# Patient Record
Sex: Female | Born: 1975 | Race: White | Hispanic: No | Marital: Married | Smoking: Never smoker
Health system: Southern US, Community
[De-identification: ages and names within clinical notes are randomized; demographics above are authoritative.]

---

## 2007-05-26 ENCOUNTER — Ambulatory Visit (HOSPITAL_COMMUNITY): Admission: RE | Admit: 2007-05-26 | Discharge: 2007-05-26 | Payer: Self-pay | Admitting: Obstetrics & Gynecology

## 2007-07-15 ENCOUNTER — Inpatient Hospital Stay (HOSPITAL_COMMUNITY): Admission: RE | Admit: 2007-07-15 | Discharge: 2007-07-18 | Payer: Self-pay | Admitting: Obstetrics & Gynecology

## 2007-07-15 ENCOUNTER — Encounter: Payer: Self-pay | Admitting: Obstetrics & Gynecology

## 2007-12-13 IMAGING — CR DG CHEST 2V
2 series · 2 of 2 positions shown · non-contrast
Comparison: none

CLINICAL DATA: CHEST ? 2 VIEW:

[view not recorded (1 of 2)]
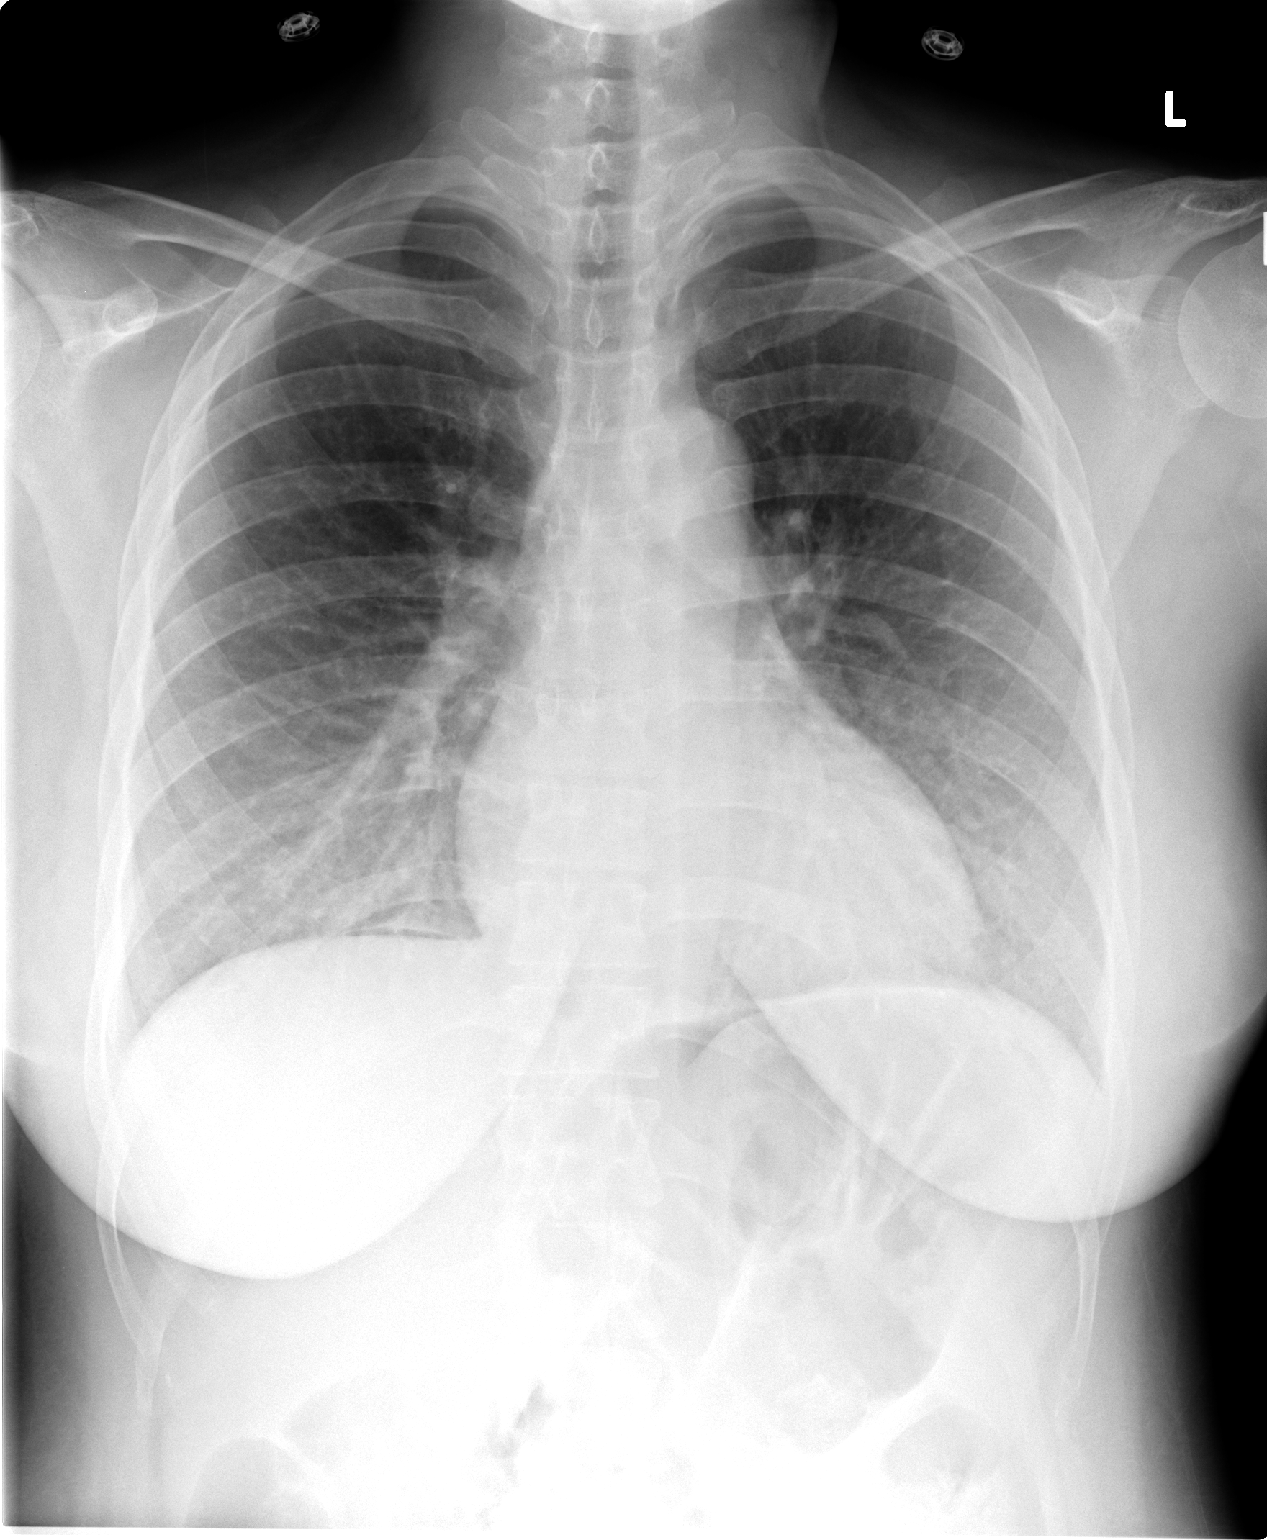

[view not recorded (2 of 2)]
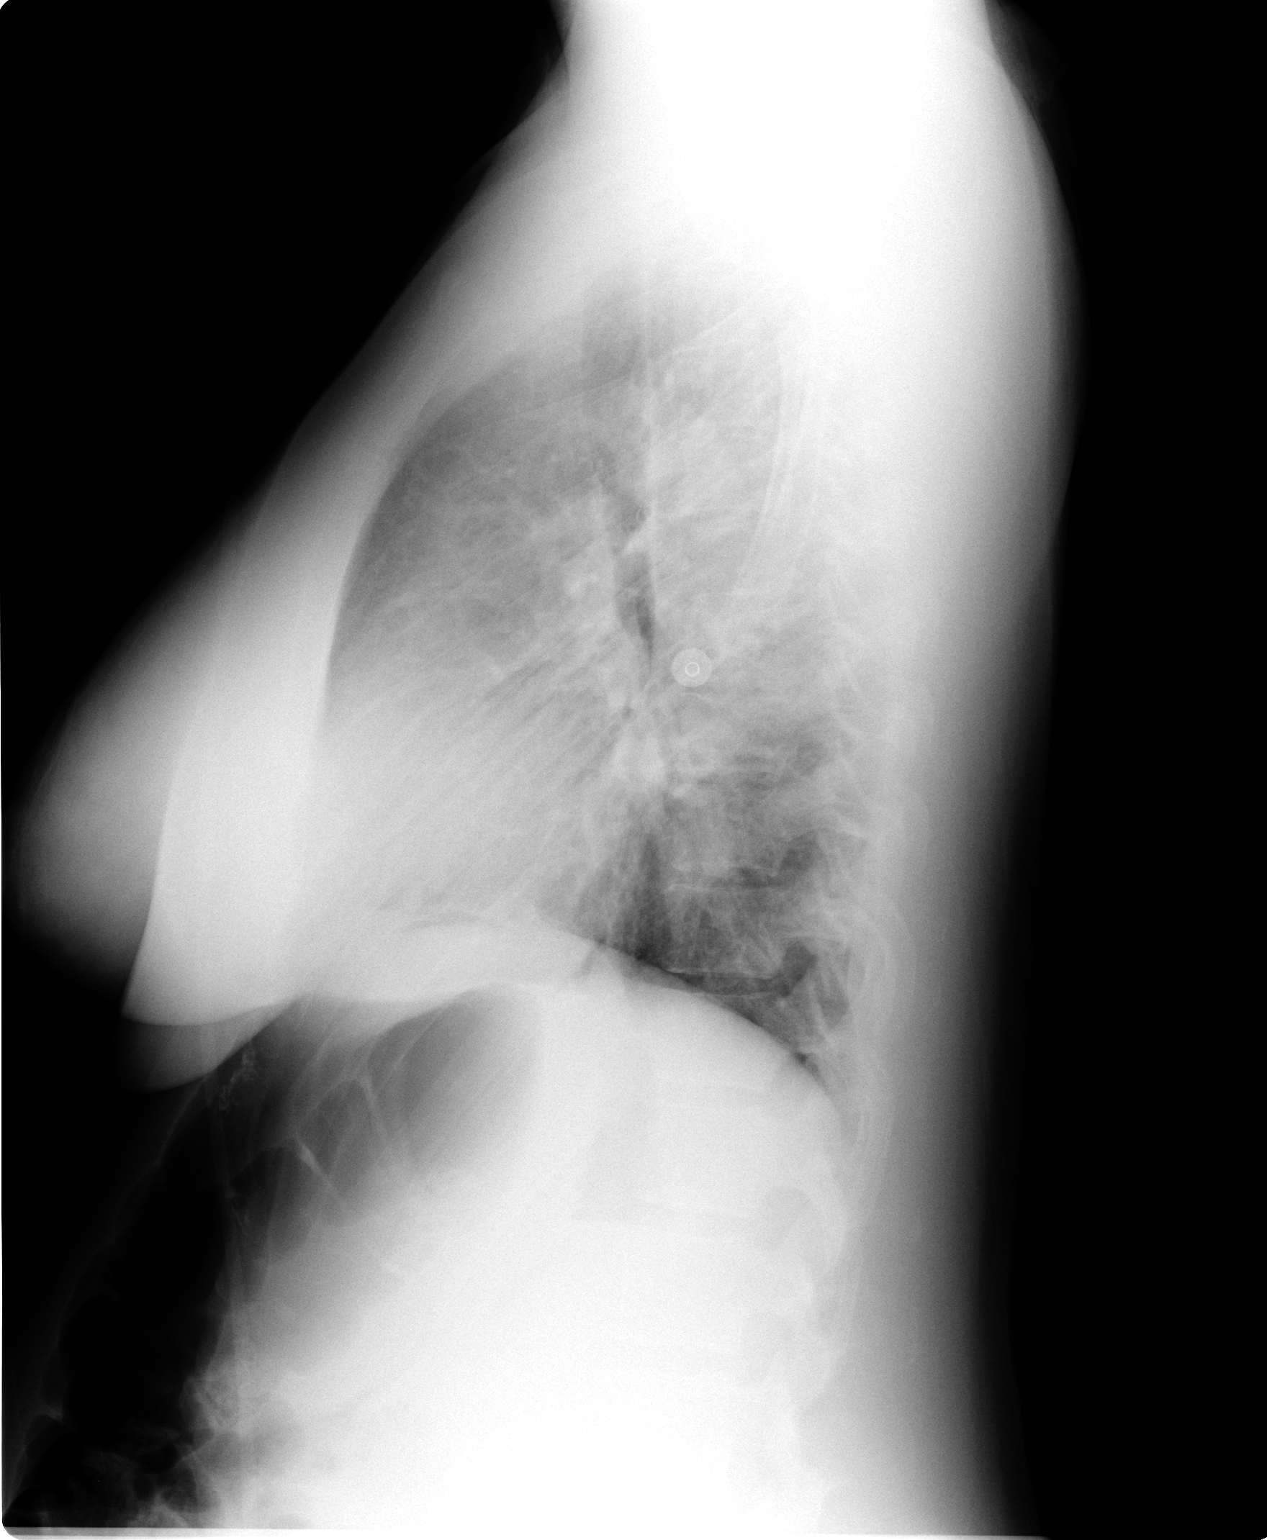

[2 of 2 positions shown; findings below may reference images not displayed]

FINDINGS: The cardiomediastinal silhouette is within normal limits.  The lung fields demonstrate a mild increase in pulmonary vascularity compatible with the patient?s recent postpartum status.  A small right pleural effusion is seen but no signs of alveolar or interstitial edema are suggested.  A small amount of bilateral subdiaphragmatic air is noted compatible with the patient?s recent C-section.  Bony structures are intact.  No radiograph stigmata of TB are noted.
IMPRESSION: Small right pleural effusion, which may be reactionary to the patient?s recent C-section.  Otherwise unremarkable postpartum chest.

## 2008-01-05 IMAGING — CR DG CHEST 2V
1 series · 2 of 2 positions shown · non-contrast
Comparison: NONE

CLINICAL DATA: Positive PPD. 

CHEST TWO VIEW (PA AND LATERAL)

[Series 1: view not recorded · 0.17mm/px · 2 of 2 slices shown]
[im 1/2]
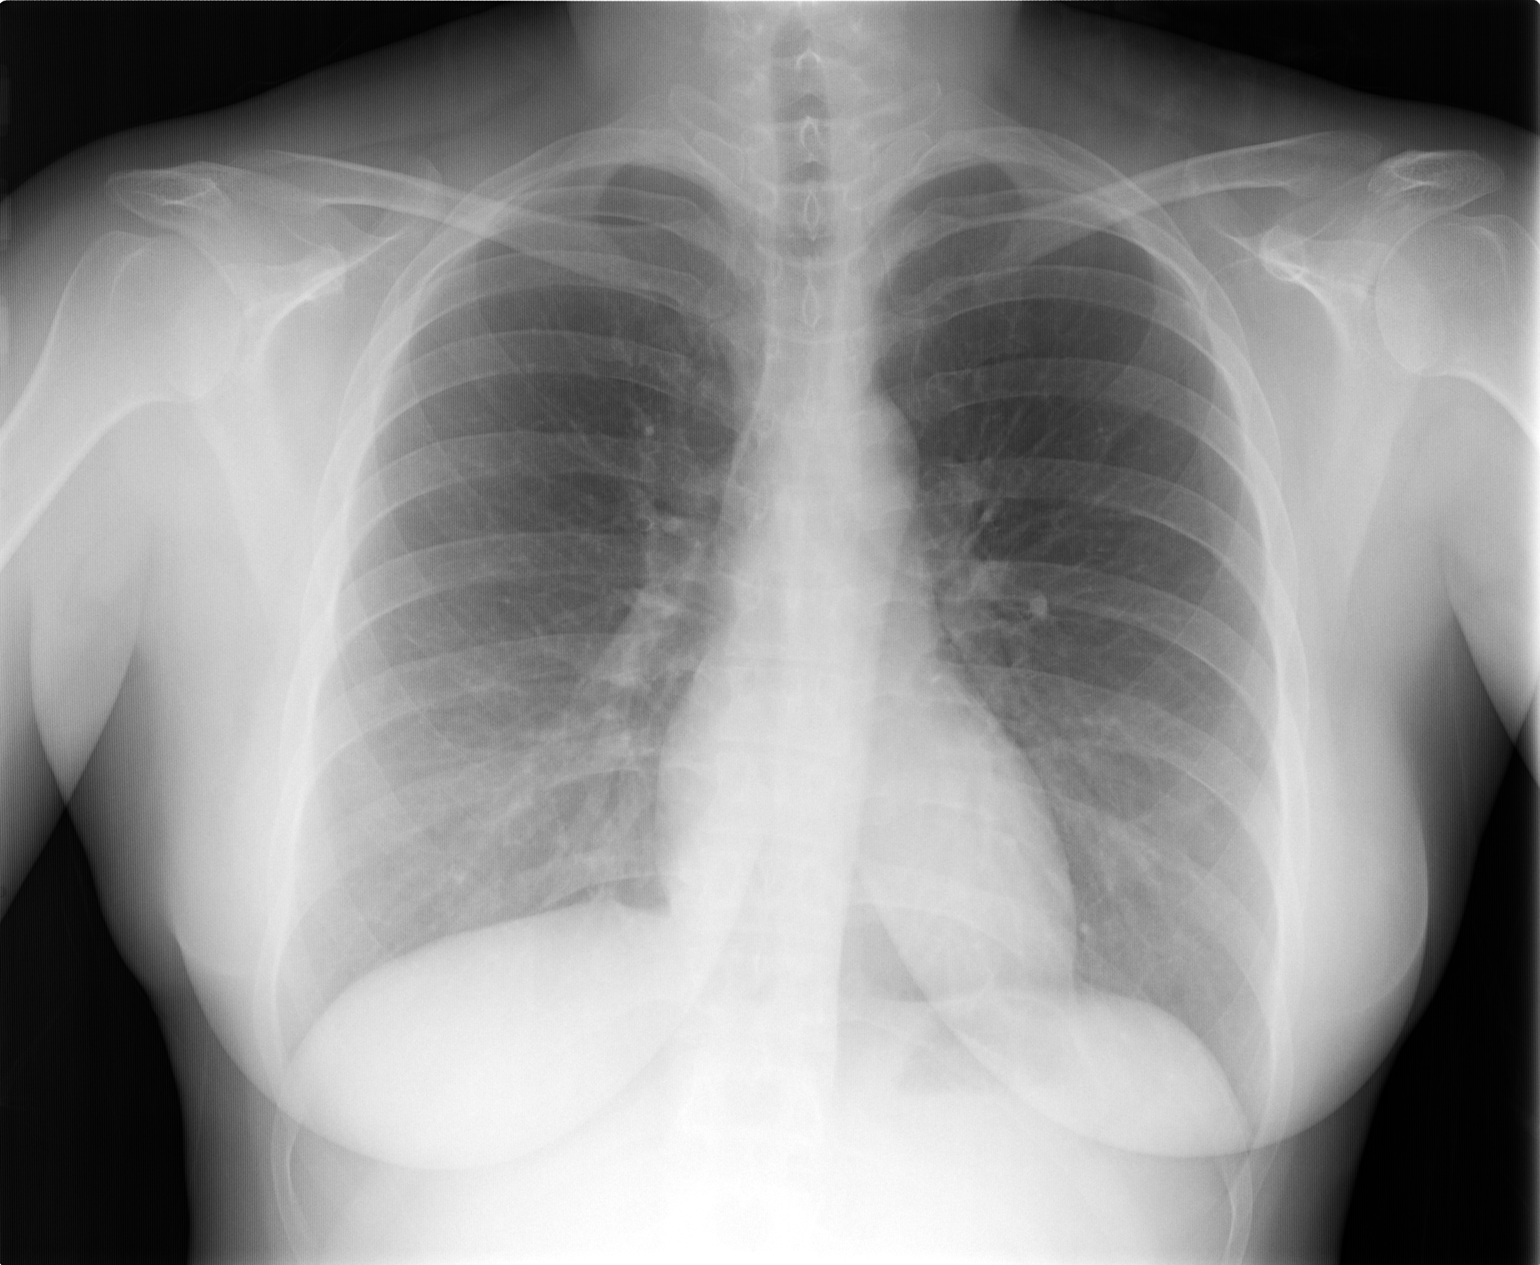
[im 2/2]
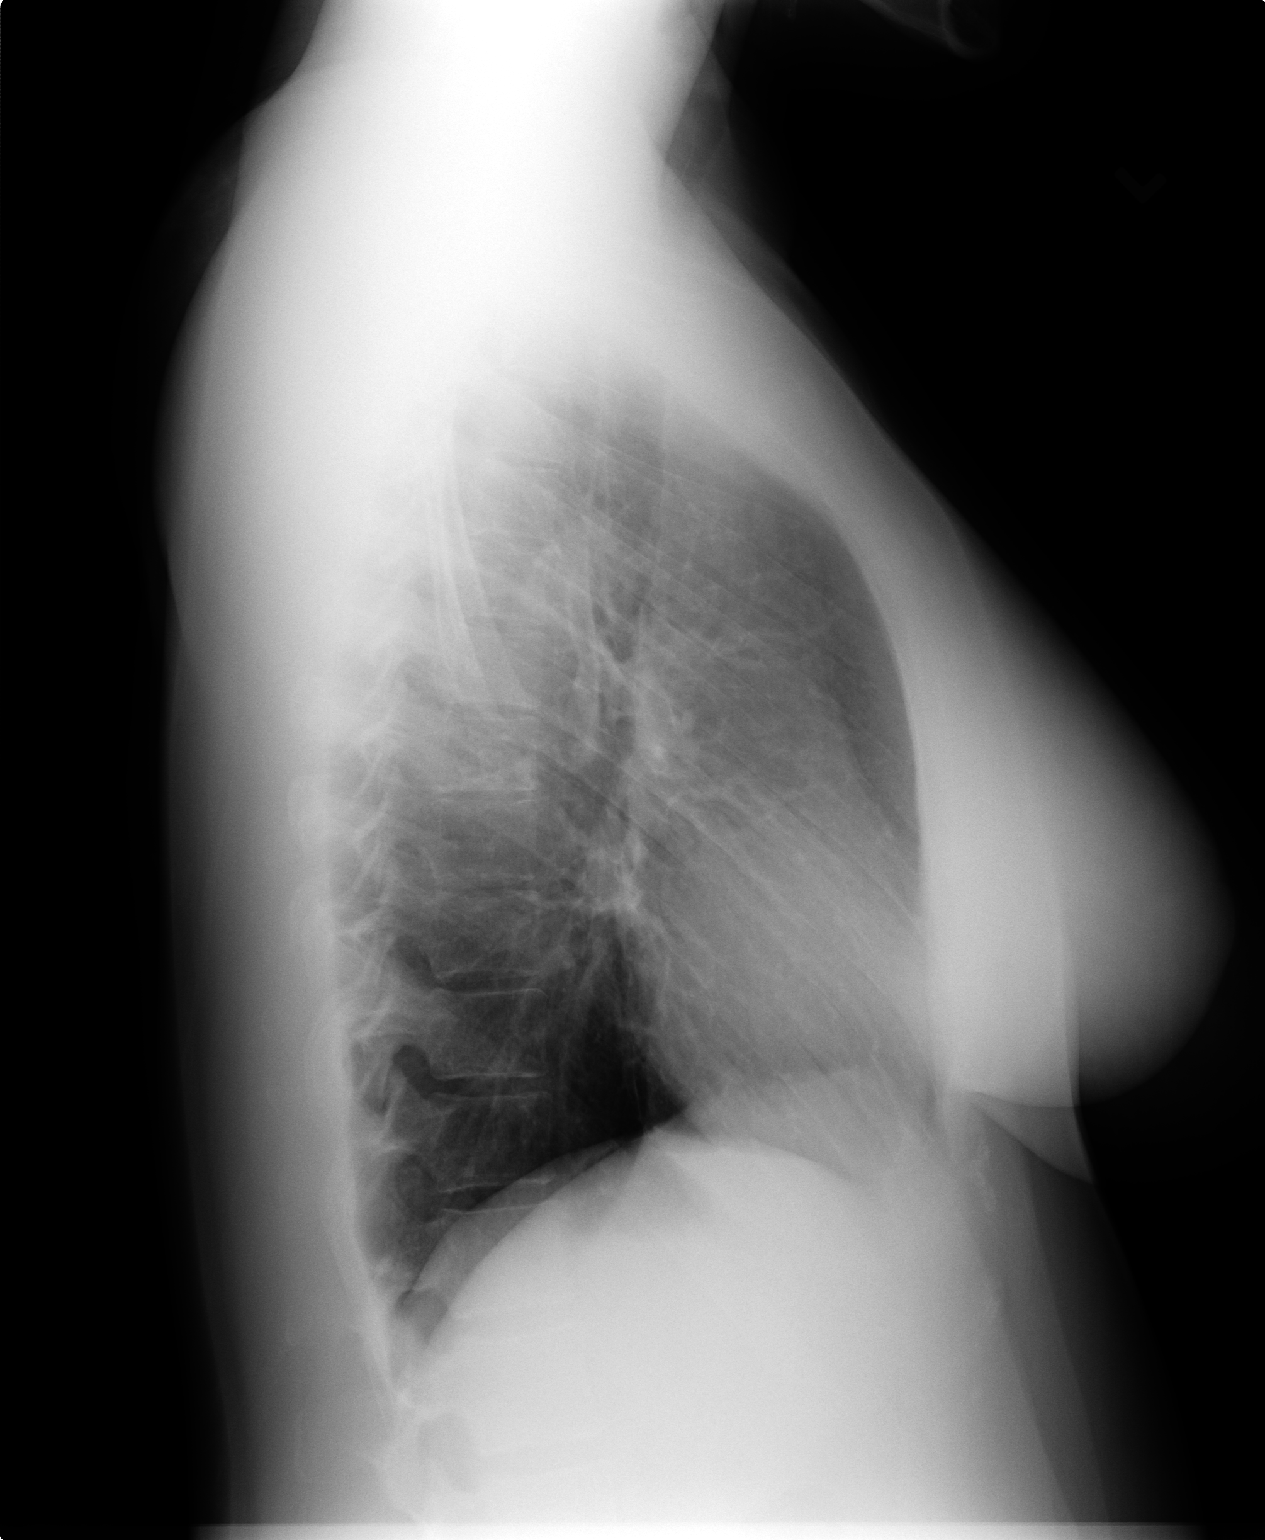

[2 of 2 positions shown; findings below may reference images not displayed]

FINDINGS: The lungs are clear and well expanded.   Heart and 
pulmonary vessels are normal.  No significant abnormalities are 
noted in the regional skeleton.
IMPRESSION: No active disease. No active tuberculosis. [place] 
08/10/2007  Tran Date: 08/10/2007 DAS  [REDACTED]

## 2011-04-02 NOTE — Op Note (Signed)
Whitney Daniels, Whitney Daniels NO.:  [id number]   MEDICAL RECORD NO.:  [id number]          PATIENT TYPE:  INP   LOCATION:  9148                          FACILITY:  [facility]   PHYSICIAN:  Roseanna Rainbow, M.D.DATE OF BIRTH:  1976/02/07   DATE OF PROCEDURE:  DATE OF DISCHARGE:                               OPERATIVE REPORT   PREOPERATIVE DIAGNOSIS:  Intrauterine pregnancy at term, history of 2  previous cesarean deliveries.   POSTOPERATIVE DIAGNOSIS:  Intrauterine pregnancy at term, history of 2  previous cesarean deliveries.   PROCEDURE:  Repeat low uterine flap elliptical cesarean delivery.   SURGEONS:  Cyd Silence.   ANESTHESIA:  Spinal.   PATHOLOGY:  Placenta.   ESTIMATED BLOOD LOSS:  600 mL.   COMPLICATIONS:  None.   PROCEDURE:  The patient was taken to the operating room with an IV  running.  She was given a spinal anesthetic and placed in the dorsal  supine position with a leftward tilt.  She was then prepped and draped  in the usual sterile fashion.  After timeout was completed, the previous  skin scar was then excised with the scalpel.  This incision was then  carried down to the underlying fascia with the Bovie.  The fascia was  incised along the length of the incision with the Bovie.  The superior  aspect of the fascial incision was tented up and the underlying rectus  muscles dissected off.  The inferior aspect of the fascial incision was  manipulated in a similar fashion.  The rectus muscles were separated in  the midline.  The parietal peritoneum was then entered.  The peritoneal  incision was then extended superiorly and inferiorly with good  visualization of the bladder.  The Alexis retractor was then placed into  the incision.  The vesicouterine peritoneum was tented up and entered  sharply.  This incision was then extended bilaterally and the bladder  flap created sharply.  The lower uterine segment was then incised in a  transverse  fashion with the scalpel.  This incision was then extended  bluntly.  The infant's head was then delivered atraumatically.  The  oropharynx was suctioned with the bulb suction.  The cord was clamped  and cut.  The infant was handed over to the awaiting neonatologist.  The  placenta was then removed.  The intrauterine cavity was evacuated of any  remaining amniotic fluid, clots and debris with a moist laparotomy  sponge.  The uterine incision was then reapproximated in a running  interlocking fashion using 0 Monocryl.  The paracolic gutters were then  irrigated.  The parietal peritoneum was reapproximated in a running  fashion using 2-0 Vicryl.  The fascia was closed in a running fashion  using a suture  of 0 PDS.  The skin was closed with staples.  At the close of the  procedure the instrument and pack counts were said to be correct x2.  A  gram of cefazolin had been given at cord clamp.  The patient was taken  to the PACU awake and in stable condition.  Roseanna Rainbow, M.D.  Electronically Signed     LAJ/MEDQ  D:  07/15/2007  T:  07/16/2007  Job:  [id number]

## 2011-04-05 NOTE — Discharge Summary (Signed)
Whitney Daniels, SAULSBURY NO.:  [id number]   MEDICAL RECORD NO.:  [id number]          PATIENT TYPE:  INP   LOCATION:  9148                          FACILITY:  [facility]   PHYSICIAN:  Roseanna Rainbow, M.D.DATE OF BIRTH:  02-27-1976   DATE OF ADMISSION:  07/15/2007  DATE OF DISCHARGE:  07/18/2007                               DISCHARGE SUMMARY   CHIEF COMPLAINT:  The patient is a 35 year old para 2 was a history of a  previous cesarean delivery with an estimated date of confinement of  September 5 with an intrauterine pregnancy at term for repeat cesarean  delivery.   HISTORY OF PRESENT ILLNESS:  Please see the above.  OB risk factors,  please see the above.  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  Prenatal  screening to rubella immune, RPR nonreactive, hemoglobin 12.3,  hematocrit 37.2, platelets 236,000, HIV nonreactive, hepatitis B surface  antigen negative, One hour GTT 92, GC probe negative, chlamydia probe  negative, blood type A+, antibody screen negative.   ALLERGIES:  No known drug allergies.   MEDICATIONS:  Please see the medication reconciliation form.   PAST GYN HISTORY:  She denies past surgical history.  Please see the  above.   PAST OBSTETRICAL HISTORY:  In 2004, she was delivered of a live born  female, 3.12 kg via cesarean delivery.  In 1999, she was delivered of a  live born female via cesarean delivery 3.2 kg.   FAMILY HISTORY:  Prostate cancer.   SOCIAL HISTORY:  She is married.  She denies any tobacco, ethanol or  drug use.   PHYSICAL EXAMINATION:  VITAL SIGNS:  Stable, afebrile.  GENERAL:  No apparent distress.  HEENT:  Normocephalic, atraumatic.  NECK:  Supple.  LUNGS:  Clear to auscultation bilaterally.  HEART:  Regular rate and rhythm.  ABDOMEN:  Gravid, pelvic exam deferred.  EXTREMITIES:  No clubbing, cyanosis or edema.  SKIN:  Without rash.   ASSESSMENT:  Intrauterine pregnancy at term.  History of two previous cesarean  deliveries.   PLAN:  Admission.  Elective repeat cesarean delivery.   HOSPITAL COURSE:  The patient was admitted.  She underwent a repeat  cesarean delivery.  Please see the dictated operative summary.  On  postoperative day #1, her hemoglobin was 10.  The remainder of her  hospital course was uneventful.  She was discharged to home on  postoperative day #3, tolerating a regular diet.   DISCHARGE DIAGNOSES:  1. Intrauterine pregnancy at term.  2 . History of two previous cesarean deliveries.   PROCEDURE:  Repeat cesarean delivery.   CONDITION:  Good.   DISCHARGE INSTRUCTIONS:  1. Diet regular.  2. Activity:  Pelvic rest, progressive activity.   MEDICATIONS:  Tylenol #3.   DISPOSITION:  The patient was to follow up in the office in two weeks.      Roseanna Rainbow, M.D.  Electronically Signed     LAJ/MEDQ  D:  08/16/2007  T:  08/17/2007  Job:  [id number]

## 2011-08-30 LAB — CBC
HCT: 29.8 — ABNORMAL LOW
HCT: 37.4
Hemoglobin: 10.5 — ABNORMAL LOW
Hemoglobin: 13
MCV: 88.9
MCV: 89.2
Platelets: 191
Platelets: 226
RDW: 13
RDW: 13.3

## 2014-12-28 ENCOUNTER — Emergency Department (HOSPITAL_COMMUNITY)
Admission: EM | Admit: 2014-12-28 | Discharge: 2014-12-28 | Disposition: A | Discharge status: Home / Self Care | Payer: No Typology Code available for payment source | Attending: Emergency Medicine | Admitting: Emergency Medicine

## 2014-12-28 ENCOUNTER — Encounter (HOSPITAL_COMMUNITY): Payer: Self-pay | Admitting: Emergency Medicine

## 2014-12-28 DIAGNOSIS — Y998 Other external cause status: Secondary | ICD-10-CM | POA: Diagnosis not present

## 2014-12-28 DIAGNOSIS — M549 Dorsalgia, unspecified: Secondary | ICD-10-CM

## 2014-12-28 DIAGNOSIS — S0990XA Unspecified injury of head, initial encounter: Secondary | ICD-10-CM | POA: Diagnosis not present

## 2014-12-28 DIAGNOSIS — R51 Headache: Secondary | ICD-10-CM

## 2014-12-28 DIAGNOSIS — M545 Low back pain, unspecified: Secondary | ICD-10-CM

## 2014-12-28 DIAGNOSIS — S299XXA Unspecified injury of thorax, initial encounter: Secondary | ICD-10-CM | POA: Insufficient documentation

## 2014-12-28 DIAGNOSIS — Y92410 Unspecified street and highway as the place of occurrence of the external cause: Secondary | ICD-10-CM | POA: Diagnosis not present

## 2014-12-28 DIAGNOSIS — S3992XA Unspecified injury of lower back, initial encounter: Secondary | ICD-10-CM | POA: Diagnosis not present

## 2014-12-28 DIAGNOSIS — Y9389 Activity, other specified: Secondary | ICD-10-CM | POA: Diagnosis not present

## 2014-12-28 DIAGNOSIS — R519 Headache, unspecified: Secondary | ICD-10-CM

## 2014-12-28 MED ORDER — HYDROCODONE-ACETAMINOPHEN 5-325 MG PO TABS: 2.0000 | ORAL_TABLET | ORAL | Status: AC | PRN

## 2014-12-28 MED ORDER — KETOROLAC TROMETHAMINE 60 MG/2ML IM SOLN
60.0000 mg | Freq: Once | INTRAMUSCULAR | Status: AC
  Administered 2014-12-28: 60 mg via INTRAMUSCULAR
  Filled 2014-12-28: qty 2
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
## 2014-12-28 NOTE — Discharge Instructions (Signed)
Return to the emergency room with worsening of symptoms, new symptoms or with symptoms that are concerning , especially fevers, loss of control of bladder or bowels, numbness or tingling around genital region or anus, weakness OR , especially severe worsening of headache, visual or speech changes, weakness in face, arms or legs. RICE: Rest, Ice (three cycles of 20 mins on, 55mns off at least twice a day), compression/brace, elevation. Heating pad works well for back pain. Ibuprofen 4037m(2 tablets 20030mevery 5-6 hours for 3-5 days  Norco for severe pain. Do not operate machinery, drive or drink alcohol while taking narcotics or muscle relaxers. Follow up with PCP/orthopedist if symptoms worsen or are persistent. Read below information and follow recommendations.  Back Injury Prevention Back injuries can be extremely painful and difficult to heal. After having one back injury, you are much more likely to experience another later on. It is important to learn how to avoid injuring or re-injuring your back. The following tips can help you to prevent a back injury. PHYSICAL FITNESS  Exercise regularly and try to develop good tone in your abdominal muscles. Your abdominal muscles provide a lot of the support needed by your back.  Do aerobic exercises (walking, jogging, biking, swimming) regularly.  Do exercises that increase balance and strength (tai chi, yoga) regularly. This can decrease your risk of falling and injuring your back.  Stretch before and after exercising.  Maintain a healthy weight. The more you weigh, the more stress is placed on your back. For every pound of weight, 10 times that amount of pressure is placed on the back. DIET  Talk to your caregiver about how much calcium and vitamin D you need per day. These nutrients help to prevent weakening of the bones (osteoporosis). Osteoporosis can cause broken (fractured) bones that lead to back pain.  Include good sources of calcium  in your diet, such as dairy products, green, leafy vegetables, and products with calcium added (fortified).  Include good sources of vitamin D in your diet, such as milk and foods that are fortified with vitamin D.  Consider taking a nutritional supplement or a multivitamin if needed.  Stop smoking if you smoke. POSTURE  Sit and stand up straight. Avoid leaning forward when you sit or hunching over when you stand.  Choose chairs with good low back (lumbar) support.  If you work at a desk, sit close to your work so you do not need to lean over. Keep your chin tucked in. Keep your neck drawn back and elbows bent at a right angle. Your arms should look like the letter "L."  Sit high and close to the steering wheel when you drive. Add a lumbar support to your car seat if needed.  Avoid sitting or standing in one position for too long. Take breaks to get up, stretch, and walk around at least once every hour. Take breaks if you are driving for long periods of time.  Sleep on your side with your knees slightly bent, or sleep on your back with a pillow under your knees. Do not sleep on your stomach. LIFTING, TWISTING, AND REACHING  Avoid heavy lifting, especially repetitive lifting. If you must do heavy lifting:  Stretch before lifting.  Work slowly.  Rest between lifts.  Use carts and dollies to move objects when possible.  Make several small trips instead of carrying 1 heavy load.  Ask for help when you need it.  Ask for help when moving big, awkward objects.  Follow these steps when lifting:  Stand with your feet shoulder-width apart.  Get as close to the object as you can. Do not try to pick up heavy objects that are far from your body.  Use handles or lifting straps if they are available.  Bend at your knees. Squat down, but keep your heels off the floor.  Keep your shoulders pulled back, your chin tucked in, and your back straight.  Lift the object slowly, tightening  the muscles in your legs, abdomen, and buttocks. Keep the object as close to the center of your body as possible.  When you put a load down, use these same guidelines in reverse.  Do not:  Lift the object above your waist.  Twist at the waist while lifting or carrying a load. Move your feet if you need to turn, not your waist.  Bend over without bending at your knees.  Avoid reaching over your head, across a table, or for an object on a high surface. OTHER TIPS  Avoid wet floors and keep sidewalks clear of ice to prevent falls.  Do not sleep on a mattress that is too soft or too hard.  Keep items that are used frequently within easy reach.  Put heavier objects on shelves at waist level and lighter objects on lower or higher shelves.  Find ways to decrease your stress, such as exercise, massage, or relaxation techniques. Stress can build up in your muscles. Tense muscles are more vulnerable to injury.  Seek treatment for depression or anxiety if needed. These conditions can increase your risk of developing back pain. SEEK MEDICAL CARE IF:  You injure your back.  You have questions about diet, exercise, or other ways to prevent back injuries. MAKE SURE YOU:  Understand these instructions.  Will watch your condition.  Will get help right away if you are not doing well or get worse. Document Released: 12/12/2004 Document Revised: 01/27/2012 Document Reviewed: 12/16/2011 [facility] Patient Information 2015 [place], [place]. This information is not intended to replace advice given to you by your health care provider. Make sure you discuss any questions you have with your health care provider. Concussion A concussion, or closed-head injury, is a brain injury caused by a direct blow to the head or by a quick and sudden movement (jolt) of the head or neck. Concussions are usually not life-threatening. Even so, the effects of a concussion can be serious. If you have had a concussion  before, you are more likely to experience concussion-like symptoms after a direct blow to the head.  CAUSES  Direct blow to the head, such as from running into another player during a soccer game, being hit in a fight, or hitting your head on a hard surface.  A jolt of the head or neck that causes the brain to move back and forth inside the skull, such as in a car crash. SIGNS AND SYMPTOMS The signs of a concussion can be hard to notice. Early on, they may be missed by you, family members, and health care providers. You may look fine but act or feel differently. Symptoms are usually temporary, but they may last for days, weeks, or even longer. Some symptoms may appear right away while others may not show up for hours or days. Every head injury is different. Symptoms include:  Mild to moderate headaches that will not go away.  A feeling of pressure inside your head.  Having more trouble than usual:  Learning or remembering things you have  heard.  Answering questions.  Paying attention or concentrating.  Organizing daily tasks.  Making decisions and solving problems.  Slowness in thinking, acting or reacting, speaking, or reading.  Getting lost or being easily confused.  Feeling tired all the time or lacking energy (fatigued).  Feeling drowsy.  Sleep disturbances.  Sleeping more than usual.  Sleeping less than usual.  Trouble falling asleep.  Trouble sleeping (insomnia).  Loss of balance or feeling lightheaded or dizzy.  Nausea or vomiting.  Numbness or tingling.  Increased sensitivity to:  Sounds.  Lights.  Distractions.  Vision problems or eyes that tire easily.  Diminished sense of taste or smell.  Ringing in the ears.  Mood changes such as feeling sad or anxious.  Becoming easily irritated or angry for little or no reason.  Lack of motivation.  Seeing or hearing things other people do not see or hear (hallucinations). DIAGNOSIS Your health  care provider can usually diagnose a concussion based on a description of your injury and symptoms. He or she will ask whether you passed out (lost consciousness) and whether you are having trouble remembering events that happened right before and during your injury. Your evaluation might include:  A brain scan to look for signs of injury to the brain. Even if the test shows no injury, you may still have a concussion.  Blood tests to be sure other problems are not present. TREATMENT  Concussions are usually treated in an emergency department, in urgent care, or at a clinic. You may need to stay in the hospital overnight for further treatment.  Tell your health care provider if you are taking any medicines, including prescription medicines, over-the-counter medicines, and natural remedies. Some medicines, such as blood thinners (anticoagulants) and aspirin, may increase the chance of complications. Also tell your health care provider whether you have had alcohol or are taking illegal drugs. This information may affect treatment.  Your health care provider will send you home with important instructions to follow.  How fast you will recover from a concussion depends on many factors. These factors include how severe your concussion is, what part of your brain was injured, your age, and how healthy you were before the concussion.  Most people with mild injuries recover fully. Recovery can take time. In general, recovery is slower in older persons. Also, persons who have had a concussion in the past or have other medical problems may find that it takes longer to recover from their current injury. HOME CARE INSTRUCTIONS General Instructions  Carefully follow the directions your health care provider gave you.  Only take over-the-counter or prescription medicines for pain, discomfort, or fever as directed by your health care provider.  Take only those medicines that your health care provider has  approved.  Do not drink alcohol until your health care provider says you are well enough to do so. Alcohol and certain other drugs may slow your recovery and can put you at risk of further injury.  If it is harder than usual to remember things, write them down.  If you are easily distracted, try to do one thing at a time. For example, do not try to watch TV while fixing dinner.  Talk with family members or close friends when making important decisions.  Keep all follow-up appointments. Repeated evaluation of your symptoms is recommended for your recovery.  Watch your symptoms and tell others to do the same. Complications sometimes occur after a concussion. Older adults with a brain injury may have  a higher risk of serious complications, such as a blood clot on the brain.  Tell your teachers, school nurse, school counselor, coach, athletic trainer, or work Freight forwarder about your injury, symptoms, and restrictions. Tell them about what you can or cannot do. They should watch for:  Increased problems with attention or concentration.  Increased difficulty remembering or learning new information.  Increased time needed to complete tasks or assignments.  Increased irritability or decreased ability to cope with stress.  Increased symptoms.  Rest. Rest helps the brain to heal. Make sure you:  Get plenty of sleep at night. Avoid staying up late at night.  Keep the same bedtime hours on weekends and weekdays.  Rest during the day. Take daytime naps or rest breaks when you feel tired.  Limit activities that require a lot of thought or concentration. These include:  Doing homework or job-related work.  Watching TV.  Working on the computer.  Avoid any situation where there is potential for another head injury (football, hockey, soccer, basketball, martial arts, downhill snow sports and horseback riding). Your condition will get worse every time you experience a concussion. You should avoid  these activities until you are evaluated by the appropriate follow-up health care providers. Returning To Your Regular Activities You will need to return to your normal activities slowly, not all at once. You must give your body and brain enough time for recovery.  Do not return to sports or other athletic activities until your health care provider tells you it is safe to do so.  Ask your health care provider when you can drive, ride a bicycle, or operate heavy machinery. Your ability to react may be slower after a brain injury. Never do these activities if you are dizzy.  Ask your health care provider about when you can return to work or school. Preventing Another Concussion It is very important to avoid another brain injury, especially before you have recovered. In rare cases, another injury can lead to permanent brain damage, brain swelling, or death. The risk of this is greatest during the first 7-10 days after a head injury. Avoid injuries by:  Wearing a seat belt when riding in a car.  Drinking alcohol only in moderation.  Wearing a helmet when biking, skiing, skateboarding, skating, or doing similar activities.  Avoiding activities that could lead to a second concussion, such as contact or recreational sports, until your health care provider says it is okay.  Taking safety measures in your home.  Remove clutter and tripping hazards from floors and stairways.  Use grab bars in bathrooms and handrails by stairs.  Place non-slip mats on floors and in bathtubs.  Improve lighting in dim areas. SEEK MEDICAL CARE IF:  You have increased problems paying attention or concentrating.  You have increased difficulty remembering or learning new information.  You need more time to complete tasks or assignments than before.  You have increased irritability or decreased ability to cope with stress.  You have more symptoms than before. Seek medical care if you have any of the following  symptoms for more than 2 weeks after your injury:  Lasting (chronic) headaches.  Dizziness or balance problems.  Nausea.  Vision problems.  Increased sensitivity to noise or light.  Depression or mood swings.  Anxiety or irritability.  Memory problems.  Difficulty concentrating or paying attention.  Sleep problems.  Feeling tired all the time. SEEK IMMEDIATE MEDICAL CARE IF:  You have severe or worsening headaches. These may be a  sign of a blood clot in the brain.  You have weakness (even if only in one hand, leg, or part of the face).  You have numbness.  You have decreased coordination.  You vomit repeatedly.  You have increased sleepiness.  One pupil is larger than the other.  You have convulsions.  You have slurred speech.  You have increased confusion. This may be a sign of a blood clot in the brain.  You have increased restlessness, agitation, or irritability.  You are unable to recognize people or places.  You have neck pain.  It is difficult to wake you up.  You have unusual behavior changes.  You lose consciousness. MAKE SURE YOU:  Understand these instructions.  Will watch your condition.  Will get help right away if you are not doing well or get worse. Document Released: 01/25/2004 Document Revised: 11/09/2013 Document Reviewed: 05/27/2013 [facility] Patient Information 2015 [place], [place]. This information is not intended to replace advice given to you by your health care provider. Make sure you discuss any questions you have with your health care provider.
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
## 2016-09-10 ENCOUNTER — Encounter (HOSPITAL_COMMUNITY): Payer: Self-pay | Admitting: Emergency Medicine

## 2016-09-10 ENCOUNTER — Emergency Department (HOSPITAL_COMMUNITY)
Admission: EM | Admit: 2016-09-10 | Discharge: 2016-09-11 | Disposition: A | Discharge status: Home / Self Care | Payer: BLUE CROSS/BLUE SHIELD | Attending: Emergency Medicine | Admitting: Emergency Medicine

## 2016-09-10 DIAGNOSIS — N760 Acute vaginitis: Secondary | ICD-10-CM | POA: Insufficient documentation

## 2016-09-10 DIAGNOSIS — B9689 Other specified bacterial agents as the cause of diseases classified elsewhere: Secondary | ICD-10-CM | POA: Diagnosis not present

## 2016-09-10 DIAGNOSIS — R102 Pelvic and perineal pain: Secondary | ICD-10-CM | POA: Diagnosis present

## 2016-09-10 LAB — POC URINE PREG, ED: PREG TEST UR: NEGATIVE

## 2016-09-10 NOTE — ED Triage Notes (Signed)
Pt states that for several days she has had dysuria that has progressed into pelvic pain which radiates to her back. Febrile at home. Alert and oriented.

## 2016-09-10 NOTE — ED Provider Notes (Signed)
[facility]-EMERGENCY DEPT Provider Note   CSN: [id number] Arrival date & time: 09/10/16  2302  By signing my name below, I, Phillis Haggis, attest that this documentation has been prepared under the direction and in the presence of Audry Pili, PA-C. Electronically Signed: Phillis Haggis, ED Scribe. 09/10/16. 12:13 AM.  History   Chief Complaint Chief Complaint  Patient presents with  . Pelvic Pain  . Dysuria   The history is provided by the patient. No language interpreter was used.   HPI Comments: Whitney Daniels is a 40 y.o. female with a hx of C-Section who presents to the Emergency Department complaining of gradually worsening, constant, sharp pelvic pain and lower abdominal pain that radiates to her back onset earlier today. Pt's current pain level is 10/10. Pt reports associated chills, low grade fever tmax 99 F, headache, and clear vaginal discharge. Pt's last BM was today. She has not taken anything for her symptoms. Pt's LMP was two weeks ago. She denies chest pain, SOB, nausea, vomiting, diarrhea, dysuria, hematuria, or vaginal bleeding.   History reviewed. No pertinent past medical history.  There are no active problems to display for this patient.   Past Surgical History:  Procedure Laterality Date  . CESAREAN SECTION      OB History    No data available       Home Medications    Prior to Admission medications   Medication Sig Start Date End Date Taking? Authorizing Provider  Acetaminophen (TYLENOL PO) Take 2 tablets by mouth daily as needed (back pain).   Yes Historical Provider, MD  ibuprofen (ADVIL,MOTRIN) 200 MG tablet Take 400 mg by mouth every 6 (six) hours as needed for moderate pain (back pain).   Yes Historical Provider, MD  Omega-3 Fatty Acids (FISH OIL PO) Take 1 capsule by mouth daily.   Yes Historical Provider, MD  phenazopyridine (PYRIDIUM) 95 MG tablet Take 95 mg by mouth 3 (three) times daily as needed for pain.   Yes Historical Provider, MD    HYDROcodone-acetaminophen (NORCO/VICODIN) 5-325 MG per tablet Take 2 tablets by mouth every 4 (four) hours as needed. Patient not taking: Reported on 09/10/2016 12/28/14   Oswaldo Conroy, PA-C    Family History History reviewed. No pertinent family history.  Social History Social History  Substance Use Topics  . Smoking status: Never Smoker  . Smokeless tobacco: Not on file  . Alcohol use No     Allergies   Caffeine  Review of Systems Review of Systems A complete 10 system review of systems was obtained and all systems are negative except as noted in the HPI and PMH.   Physical Exam Updated Vital Signs BP 119/67 (BP Location: Left Arm)   Pulse 82   Temp 98 F (36.7 C) (Oral)   Resp 18   LMP 08/27/2016 (Approximate)   SpO2 100%   Physical Exam  Constitutional: She is oriented to person, place, and time. She appears well-developed and well-nourished.  HENT:  Head: Normocephalic and atraumatic.  Eyes: EOM are normal. Pupils are equal, round, and reactive to light.  Neck: Normal range of motion. Neck supple.  Cardiovascular: Normal rate, regular rhythm and normal heart sounds.  Exam reveals no gallop and no friction rub.   No murmur heard. Pulmonary/Chest: Effort normal and breath sounds normal. She has no wheezes.  Abdominal: Soft. There is tenderness in the suprapubic area.  Musculoskeletal: Normal range of motion.  Neurological: She is alert and oriented to person, place, and time.  Skin: Skin is warm and dry.  Psychiatric: She has a normal mood and affect. Her behavior is normal.  Nursing note and vitals reviewed.  Exam performed by Eston Estersyler M Yadriel Kerrigan,  exam chaperoned Date: 09/11/2016 Pelvic exam: normal external genitalia without evidence of trauma. VULVA: normal appearing vulva with no masses, tenderness or lesion. VAGINA: normal appearing vagina with normal color and discharge, no lesions. CERVIX: normal appearing cervix without lesions, cervical motion  tenderness absent, cervical os closed with out purulent discharge; vaginal discharge - clear and copious, Wet prep and DNA probe for chlamydia and GC obtained.   ADNEXA: normal adnexa in size, nontender and no masses UTERUS: uterus is normal size, shape, consistency and nontender.   ED Treatments / Results  DIAGNOSTIC STUDIES: Oxygen Saturation is 100% on RA, normal by my interpretation.    Labs (all labs ordered are listed, but only abnormal results are displayed) Labs Reviewed  WET PREP, GENITAL - Abnormal; Notable for the following:       Result Value   Clue Cells Wet Prep HPF POC PRESENT (*)    WBC, Wet Prep HPF POC MANY (*)    All other components within normal limits  URINALYSIS, ROUTINE W REFLEX MICROSCOPIC (NOT AT [facility]) - Abnormal; Notable for the following:    Specific Gravity, Urine 1.003 (*)    pH 8.5 (*)    Leukocytes, UA SMALL (*)    All other components within normal limits  CBC - Abnormal; Notable for the following:    WBC 18.7 (*)    All other components within normal limits  URINE MICROSCOPIC-ADD ON - Abnormal; Notable for the following:    Squamous Epithelial / LPF 0-5 (*)    Bacteria, UA RARE (*)    All other components within normal limits  BASIC METABOLIC PANEL  PREGNANCY, URINE  POC URINE PREG, ED  GC/CHLAMYDIA PROBE AMP ([place]) NOT AT [facility]   EKG  EKG Interpretation None      Radiology Ct Abdomen Pelvis W Contrast  Result Date: 09/11/2016 CLINICAL DATA:  40 year old female with pelvic pain. EXAM: CT ABDOMEN AND PELVIS WITH CONTRAST TECHNIQUE: Multidetector CT imaging of the abdomen and pelvis was performed using the standard protocol following bolus administration of intravenous contrast. CONTRAST:  100mL ISOVUE-300 IOPAMIDOL (ISOVUE-300) INJECTION 61% COMPARISON:  None. FINDINGS: Lower chest: The visualized lung bases are clear. No intra-abdominal free air.  No free fluid. Hepatobiliary: No focal liver abnormality is seen. No gallstones,  gallbladder wall thickening, or biliary dilatation. Pancreas: Unremarkable. No pancreatic ductal dilatation or surrounding inflammatory changes. Spleen: Normal in size without focal abnormality. Adrenals/Urinary Tract: The adrenal glands appear unremarkable. The kidneys, and visualized ureters as well as the urinary bladder appear unremarkable. There is minimal haziness of the anterior bladder wall. Correlation with urinalysis recommended to exclude UTI. Stomach/Bowel: There is loose stool throughout the colon compatible with diarrheal state. Correlation with clinical exam and stool cultures recommended. There is no evidence of bowel obstruction or active inflammation. Multiple nondistended and non inflamed fluid-filled loops of small bowel noted. Clinical correlation recommended to evaluate for enteritis. There is minimal haziness of the fat surrounding the tip of the appendix, likely related to mild diffuse mesenteric stranding. An early acute tip appendicitis is much less likely. Vascular/Lymphatic: No significant vascular findings are present. No enlarged abdominal or pelvic lymph nodes. Reproductive: The uterus is slightly heterogeneous. Small fluid may be present within the endocervical canal. Small cystic structures in the region of the cervix are not  well characterized but may represent minimal hand cyst. There is a 2.3 cm left ovarian dominant follicle/cyst. Ultrasound may provide better evaluation of the pelvic structures. Other: There is mild diffuse engorgement of the mesentery vasculature and mild diffuse mesenteric edema. No fluid collection or abscess. Musculoskeletal: No acute or significant osseous findings. IMPRESSION: Diarrheal state with findings concerning for mild enteritis. Correlation with clinical exam and stool cultures recommended. No evidence of bowel obstruction. No CT evidence of acute appendicitis. Mild haziness of the anterior wall of the bladder. Correlation with urinalysis  recommended to exclude UTI. A 2.3 cm left ovarian dominant follicle/cyst. Electronically Signed   By: Elgie Collard M.D.   On: 09/11/2016 04:16    Procedures Procedures (including critical care time)  Medications Ordered in ED Medications - No data to display  Initial Impression / Assessment and Plan / ED Course  I have reviewed the triage vital signs and the nursing notes.  Pertinent labs & imaging results that were available during my care of the patient were reviewed by me and considered in my medical decision making (see chart for details).  Clinical Course   Final Clinical Impressions(s) / ED Diagnoses  I have reviewed and evaluated the relevant laboratory values I have reviewed the relevant previous healthcare records. I obtained HPI from historian.  ED Course: 12:11 AM-Discussed treatment plan which includes IV fluids, labs and pelvic exam with pt at bedside and pt agreed to plan.   Assessment: Pt is a 40yF with no significant PMH who presents with suprapubic abdominal pain this AM. No N/V/D. No dysuria. Notes fever at home. On exam, pt in NAD. Nontoxic/nonseptic appearing. VSS. Afebrile. Lungs CTA. Heart RRR. Abdomen TTP suprapubic. GU exam with no CMT or Adnexal Tenderness. Noted clear discharge. CBC showed leukocytosis 18.7. BMP unremarkable. Wet Prep showed clue cells and WBCs. UA Unremarkable. Given Rocephin and Azithro. Due to WBC and subjective fevers, ordered CT Abdomen/Pelvis, which showed mild enteritis. NO appendicitis.. Will treat for potential PID. Given Doxy. Counseled on Flagyl afterwards. I have reviewed the [place] Controlled Substance Reporting System. Plan is to DC home with follow up to PCP. At time of discharge, Patient is in no acute distress. Vital Signs are stable. Patient is able to ambulate. Patient able to tolerate PO.    Disposition/Plan:  DC Home Additional Verbal discharge instructions given and discussed with patient.  Pt Instructed to f/u  with PCP in the next week for evaluation and treatment of symptoms. Return precautions given Pt acknowledges and agrees with plan  Supervising Physician Devoria Albe, MD  I personally performed the services described in this documentation, which was scribed in my presence. The recorded information has been reviewed and is accurate.   Final diagnoses:  Pelvic pain  Bacterial vaginosis    New Prescriptions New Prescriptions   No medications on file     Audry Pili, PA-C 09/11/16 0425    Devoria Albe, MD 09/11/16 [phone]

## 2016-09-11 ENCOUNTER — Encounter (HOSPITAL_COMMUNITY): Payer: Self-pay | Admitting: Radiology

## 2016-09-11 ENCOUNTER — Emergency Department (HOSPITAL_COMMUNITY): Payer: BLUE CROSS/BLUE SHIELD

## 2016-09-11 LAB — URINALYSIS, ROUTINE W REFLEX MICROSCOPIC
BILIRUBIN URINE: NEGATIVE
Glucose, UA: NEGATIVE mg/dL
Hgb urine dipstick: NEGATIVE
KETONES UR: NEGATIVE mg/dL
NITRITE: NEGATIVE
PROTEIN: NEGATIVE mg/dL
SPECIFIC GRAVITY, URINE: 1.003 — AB (ref 1.005–1.030)
pH: 8.5 — ABNORMAL HIGH (ref 5.0–8.0)

## 2016-09-11 LAB — BASIC METABOLIC PANEL
ANION GAP: 7 (ref 5–15)
BUN: 10 mg/dL (ref 6–20)
CO2: 25 mmol/L (ref 22–32)
Calcium: 8.9 mg/dL (ref 8.9–10.3)
Chloride: 105 mmol/L (ref 101–111)
Creatinine, Ser: 0.83 mg/dL (ref 0.44–1.00)
Glucose, Bld: 98 mg/dL (ref 65–99)
POTASSIUM: 3.8 mmol/L (ref 3.5–5.1)
SODIUM: 137 mmol/L (ref 135–145)

## 2016-09-11 LAB — CBC
HCT: 38.2 % (ref 36.0–46.0)
Hemoglobin: 12.8 g/dL (ref 12.0–15.0)
MCH: 29 pg (ref 26.0–34.0)
MCHC: 33.5 g/dL (ref 30.0–36.0)
MCV: 86.4 fL (ref 78.0–100.0)
PLATELETS: 283 10*3/uL (ref 150–400)
RBC: 4.42 MIL/uL (ref 3.87–5.11)
RDW: 13 % (ref 11.5–15.5)
WBC: 18.7 10*3/uL — AB (ref 4.0–10.5)

## 2016-09-11 LAB — WET PREP, GENITAL
Sperm: NONE SEEN
TRICH WET PREP: NONE SEEN
YEAST WET PREP: NONE SEEN

## 2016-09-11 LAB — URINE MICROSCOPIC-ADD ON: RBC / HPF: NONE SEEN RBC/hpf (ref 0–5)

## 2016-09-11 LAB — GC/CHLAMYDIA PROBE AMP (~~LOC~~) NOT AT ARMC
CHLAMYDIA, DNA PROBE: NEGATIVE
Neisseria Gonorrhea: NEGATIVE

## 2016-09-11 LAB — PREGNANCY, URINE: PREG TEST UR: NEGATIVE

## 2016-09-11 MED ORDER — MORPHINE SULFATE (PF) 2 MG/ML IV SOLN
4.0000 mg | Freq: Once | INTRAVENOUS | Status: AC
  Administered 2016-09-11: 4 mg via INTRAVENOUS
  Filled 2016-09-11: qty 2

## 2016-09-11 MED ORDER — OXYCODONE-ACETAMINOPHEN 5-325 MG PO TABS: 1.0000 | ORAL_TABLET | Freq: Four times a day (QID) | ORAL | 0 refills | Status: AC | PRN

## 2016-09-11 MED ORDER — AZITHROMYCIN 250 MG PO TABS
1000.0000 mg | ORAL_TABLET | Freq: Once | ORAL | Status: AC
  Administered 2016-09-11: 1000 mg via ORAL
  Filled 2016-09-11: qty 4

## 2016-09-11 MED ORDER — DOXYCYCLINE HYCLATE 100 MG PO CAPS: 100.0000 mg | ORAL_CAPSULE | Freq: Two times a day (BID) | ORAL | 0 refills | Status: AC

## 2016-09-11 MED ORDER — SODIUM CHLORIDE 0.9 % IV BOLUS (SEPSIS)
1000.0000 mL | Freq: Once | INTRAVENOUS | Status: AC
  Administered 2016-09-11: 1000 mL via INTRAVENOUS

## 2016-09-11 MED ORDER — IOPAMIDOL (ISOVUE-300) INJECTION 61%
100.0000 mL | Freq: Once | INTRAVENOUS | Status: AC | PRN
  Administered 2016-09-11: 100 mL via INTRAVENOUS

## 2016-09-11 MED ORDER — METRONIDAZOLE 500 MG PO TABS
500.0000 mg | ORAL_TABLET | Freq: Two times a day (BID) | ORAL | 0 refills | Status: AC
Start: 2016-09-11 — End: ?

## 2016-09-11 MED ORDER — CEFTRIAXONE SODIUM 250 MG IJ SOLR
250.0000 mg | Freq: Once | INTRAMUSCULAR | Status: AC
  Administered 2016-09-11: 250 mg via INTRAMUSCULAR
  Filled 2016-09-11: qty 250

## 2016-09-11 MED ORDER — LIDOCAINE HCL 1 % IJ SOLN
INTRAMUSCULAR | Status: AC
  Administered 2016-09-11: 20 mL
  Filled 2016-09-11: qty 20
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
